# Patient Record
Sex: Male | Born: 1955
Health system: Southern US, Community
[De-identification: ages and names within clinical notes are randomized; demographics above are authoritative.]

## PROBLEM LIST (undated history)

## (undated) DIAGNOSIS — F419 Anxiety disorder, unspecified: Secondary | ICD-10-CM

## (undated) DIAGNOSIS — N200 Calculus of kidney: Secondary | ICD-10-CM

## (undated) DIAGNOSIS — N289 Disorder of kidney and ureter, unspecified: Secondary | ICD-10-CM

## (undated) DIAGNOSIS — E785 Hyperlipidemia, unspecified: Secondary | ICD-10-CM

## (undated) DIAGNOSIS — E7800 Pure hypercholesterolemia, unspecified: Secondary | ICD-10-CM

## (undated) DIAGNOSIS — G4700 Insomnia, unspecified: Secondary | ICD-10-CM

## (undated) HISTORY — DX: Insomnia, unspecified: G47.00

## (undated) HISTORY — DX: Anxiety disorder, unspecified: F41.9

## (undated) HISTORY — DX: Calculus of kidney: N20.0

## (undated) HISTORY — DX: Hyperlipidemia, unspecified: E78.5

## (undated) HISTORY — PX: HERNIA REPAIR: SHX51

## (undated) HISTORY — PX: VASECTOMY: SHX75

---

## 2000-12-21 ENCOUNTER — Ambulatory Visit: Admission: RE | Admit: 2000-12-21 | Discharge: 2000-12-21 | Payer: Self-pay | Admitting: *Deleted

## 2005-02-14 ENCOUNTER — Encounter: Admission: RE | Admit: 2005-02-14 | Discharge: 2005-02-14 | Payer: Self-pay | Admitting: General Surgery

## 2005-02-21 ENCOUNTER — Ambulatory Visit (HOSPITAL_COMMUNITY): Admission: RE | Admit: 2005-02-21 | Discharge: 2005-02-21 | Payer: Self-pay | Admitting: General Surgery

## 2005-02-21 ENCOUNTER — Ambulatory Visit (HOSPITAL_BASED_OUTPATIENT_CLINIC_OR_DEPARTMENT_OTHER): Admission: RE | Admit: 2005-02-21 | Discharge: 2005-02-21 | Payer: Self-pay | Admitting: General Surgery

## 2007-09-10 ENCOUNTER — Ambulatory Visit (HOSPITAL_COMMUNITY): Admission: RE | Admit: 2007-09-10 | Discharge: 2007-09-10 | Payer: Self-pay | Admitting: *Deleted

## 2010-11-15 NOTE — Op Note (Signed)
NAME:  Benjamin Caldwell, CALLENS NO.:  [id number]   MEDICAL RECORD NO.:  [id number]          PATIENT TYPE:  AMB   LOCATION:  ENDO                         FACILITY:  [facility]   PHYSICIAN:  Georgiana Spinner, M.D.    DATE OF BIRTH:  1956/05/05   DATE OF PROCEDURE:  09/10/2007  DATE OF DISCHARGE:                               OPERATIVE REPORT   PROCEDURE:  Colonoscopy.   INDICATIONS:  Rectal bleeding.   ANESTHESIA:  Demerol 70 mg, Versed 7 mg.   PROCEDURE:  With the patient mildly sedated in the left lateral  decubitus position, a rectal examination was performed which, to my  exam, was limited but normal.  Subsequently, the Pentax videoscopic  colonoscope was inserted to the rectum and passed under direct vision to  the cecum, identified by ileocecal valve and appendiceal orifice.  The  prep was slightly suboptimal, especially in the right colon where there  was yellowish material coating the mucosa which we could wash and  suction as best as possible, but some residual remained despite this.  From this point, the colonoscope was then slowly withdrawn taking  circumferential views of colonic mucosa after photographing the  ileocecal valve and appendiceal orifice, stopping next only in the  rectum which appeared normal on direct and showed hemorrhoids on  retroflexed view.  The endoscope was straightened and withdrawn.  The  patient's vital signs and pulse oximeter remained stable.  The patient  tolerated the procedure well without apparent complication.   FINDINGS:  Slightly suboptimal prep.  The patient indicates he did not  drink the whole prep, but left about over a glass in the container which  may have contributed to this poor prep, but otherwise an unremarkable  examination other than internal hemorrhoids.   PLAN:  Because of the prep, suggest repeat examination in five years or  as needed.           ______________________________  Georgiana Spinner, M.D.     GMO/MEDQ  D:  09/10/2007  T:  09/11/2007  Job:  [id number]

## 2010-11-18 NOTE — Op Note (Signed)
NAME:  Benjamin Caldwell, Benjamin Caldwell          ACCOUNT NO.:  [id number]   MEDICAL RECORD NO.:  [id number]          PATIENT TYPE:  AMB   LOCATION:  [facility]                         FACILITY:  [facility]   PHYSICIAN:  Adolph Pollack, M.D.DATE OF BIRTH:  Sep 17, 1955   DATE OF PROCEDURE:  02/21/2005  DATE OF DISCHARGE:                                 OPERATIVE REPORT   PREOPERATIVE DIAGNOSIS:  Recurrent left inguinal hernia.   POSTOPERATIVE DIAGNOSIS:  Recurrent left inguinal hernia.   PROCEDURE:  Laparoscopic repair of recurrent left inguinal hernia with mesh.   SURGEON:  Adolph Pollack, M.D.   ANESTHESIA:  General.   INDICATIONS:  This 55 year old male had a left inguinal hernia about 12  years ago. He noted some discomfort and swelling in the left groin and was  found to have a recurrent left inguinal hernia and now presents for repair.  We discussed the procedure and the risks preoperatively.   TECHNIQUE:  He is seen in the holding area and the left groin marked with my  initials. He is then brought to the operating room, placed supine on the  operating table and a general anesthetic was administered. Immediately  before coming to the operating room, he had voided. The hair on the  abdominal wall and groin area was clipped and the area was sterilely prepped  and draped. Dilute Marcaine solution was infiltrated in the subumbilical  region and a subumbilical incision was made in a transverse fashion through  the skin and subcutaneous tissue. Using blunt dissection, a left anterior  rectus sheath was identified and a small incision made in it. The underlying  left rectus muscle was retracted laterally and the posterior sheath  identified. A balloon dissection device was then placed into the  extraperitoneal space and balloon dissection was performed under  laparoscopic vision. This was mostly on the left side and mid portion of the  lower midline area. Following this, a trocar was placed into  the  extraperitoneal space and CO2 gas insufflated. The laparoscope was  introduced and under direct vision, two 5 mm trocars were placed in the  lower abdomen just to the right of the midline. I used blunt dissection to  identify the symphysis pubis and Cooper's ligament on the left. The direct  hernia could be identified. Using blunt dissection, I dissected fibrofatty  tissue away from the anterior and lateral abdominal wall up to the level of  the umbilicus. I subsequently used blunt dissection to isolate the spermatic  cord and dissect the peritoneum away from this. A small hole was made in the  peritoneum during the dissection, but I did not think it needed to be closed  and I was able to strip the peritoneum back to the level of the umbilicus.   I then reduced the contents of the recurrent direct hernia (extraperitoneal  fat). Once I had done this, I then brought a piece of 5 x 6 inch mesh with a  partial longitudinal slit cut into it and placed it into the left  extraperitoneal space and positioned it so as to cover the direct, indirect  and femoral  spaces. It was then anchored to Cooper's ligament, the anterior  and lateral abdominal walls with spiral tacks. The two tails of the mesh  were wrapped around the cord creating a new internal ring.   At this time, hemostasis was adequate. The inferior lateral aspect of the  mesh was held down. I then released the CO2 gas and watched the  extraperitoneal contents approximate the mesh. The remaining instruments and  trocars were removed and the remaining CO2 gas was evacuated as much as  possible.   The left anterior rectus sheath defect was then closed with interrupted zero  Vicryl sutures. Skin incisions were closed with 4-0 Monocryl subcuticular  stitches followed by Steri-Strips and sterile dressings. He tolerated the  procedure well without any apparent complications and was taken to the  recovery room in satisfactory  condition.      Adolph Pollack, M.D.  Electronically Signed     TJR/MEDQ  D:  02/21/2005  T:  02/22/2005  Job:  [id number]   cc:   Soyla Murphy. Renne Crigler, M.D.  [address]  [place]  [place] [address]  Fax: [phone]

## 2011-07-25 ENCOUNTER — Other Ambulatory Visit: Payer: Self-pay | Admitting: Internal Medicine

## 2011-07-25 DIAGNOSIS — I714 Abdominal aortic aneurysm, without rupture: Secondary | ICD-10-CM

## 2011-07-31 ENCOUNTER — Ambulatory Visit
Admission: RE | Admit: 2011-07-31 | Discharge: 2011-07-31 | Disposition: A | Discharge status: Home / Self Care | Payer: 59 | Source: Ambulatory Visit | Attending: Internal Medicine | Admitting: Internal Medicine

## 2011-07-31 DIAGNOSIS — I714 Abdominal aortic aneurysm, without rupture: Secondary | ICD-10-CM

## 2014-04-19 ENCOUNTER — Encounter (HOSPITAL_COMMUNITY): Payer: Self-pay | Admitting: Emergency Medicine

## 2014-04-19 ENCOUNTER — Emergency Department (HOSPITAL_COMMUNITY)
Admission: EM | Admit: 2014-04-19 | Discharge: 2014-04-19 | Disposition: A | Discharge status: Home / Self Care | Payer: 59 | Attending: Emergency Medicine | Admitting: Emergency Medicine

## 2014-04-19 ENCOUNTER — Emergency Department (HOSPITAL_COMMUNITY): Payer: 59

## 2014-04-19 DIAGNOSIS — R079 Chest pain, unspecified: Secondary | ICD-10-CM | POA: Diagnosis not present

## 2014-04-19 HISTORY — DX: Pure hypercholesterolemia, unspecified: E78.00

## 2014-04-19 LAB — CBC WITH DIFFERENTIAL/PLATELET
Basophils Absolute: 0 10*3/uL (ref 0.0–0.1)
Basophils Relative: 1 % (ref 0–1)
Eosinophils Absolute: 0.1 10*3/uL (ref 0.0–0.7)
Eosinophils Relative: 4 % (ref 0–5)
HCT: 44.4 % (ref 39.0–52.0)
Hemoglobin: 15.1 g/dL (ref 13.0–17.0)
Lymphocytes Relative: 39 % (ref 12–46)
Lymphs Abs: 1.4 10*3/uL (ref 0.7–4.0)
MCH: 31.6 pg (ref 26.0–34.0)
MCHC: 34 g/dL (ref 30.0–36.0)
MCV: 92.9 fL (ref 78.0–100.0)
Monocytes Absolute: 0.3 10*3/uL (ref 0.1–1.0)
Monocytes Relative: 9 % (ref 3–12)
Neutro Abs: 1.7 10*3/uL (ref 1.7–7.7)
Neutrophils Relative %: 47 % (ref 43–77)
Platelets: 212 10*3/uL (ref 150–400)
RBC: 4.78 MIL/uL (ref 4.22–5.81)
RDW: 13 % (ref 11.5–15.5)
WBC: 3.5 10*3/uL — ABNORMAL LOW (ref 4.0–10.5)

## 2014-04-19 LAB — TROPONIN I: Troponin I: <0.3 ng/mL (ref <0.30)

## 2014-04-19 LAB — BASIC METABOLIC PANEL
Anion gap: 11 (ref 5–15)
BUN: 17 mg/dL (ref 6–23)
CO2: 27 mEq/L (ref 19–32)
Calcium: 9.4 mg/dL (ref 8.4–10.5)
Chloride: 103 mEq/L (ref 96–112)
Creatinine, Ser: 0.93 mg/dL (ref 0.50–1.35)
GFR calc Af Amer: >90 mL/min (ref >90)
GFR calc non Af Amer: >90 mL/min (ref >90)
Glucose, Bld: 100 mg/dL — ABNORMAL HIGH (ref 70–99)
Potassium: 4.3 mEq/L (ref 3.7–5.3)
Sodium: 141 mEq/L (ref 137–147)

## 2014-04-19 NOTE — ED Notes (Signed)
Patient returned from X-ray 

## 2014-04-19 NOTE — ED Notes (Signed)
Patient transported to X-ray 

## 2014-04-19 NOTE — Discharge Instructions (Signed)

## 2014-04-19 NOTE — ED Notes (Signed)
Pt. Stated, when i GOT UP THIS MORNING TO GET READY FOR [facility] I STARTED TO HAVE SOME CHEST PAIN  With some nausea and some dizziness.  It came on real sudden and it was gone after 15 minutes.

## 2014-04-19 NOTE — ED Provider Notes (Signed)
CSN: [id number]     Arrival date & time 04/19/14  [id number] History   First MD Initiated Contact with Patient 04/19/14 1021     Chief Complaint  Patient presents with  . Chest Pain     (Consider location/radiation/quality/duration/timing/severity/associated sxs/prior Treatment) HPI  58yM with CP. Onset shortly before arrival while at home getting ready for church. Substernal pressure. Mild nausea. Pain did not radiate. Gradually subsided. Currently no complaints. No sob. No fever or chills. No unusual leg pain or swelling. Non smoker. No known CAD. Hx of HLD.   Past Medical History  Diagnosis Date  . Elevated cholesterol    History reviewed. No pertinent past surgical history. No family history on file. History  Substance Use Topics  . Smoking status: Never Smoker   . Smokeless tobacco: Not on file  . Alcohol Use: No    Review of Systems  All systems reviewed and negative, other than as noted in HPI.   Allergies  Review of patient's allergies indicates not on file.  Home Medications   Prior to Admission medications   Not on File   BP 144/71  Pulse 63  Temp(Src) 98.2 F (36.8 C) (Oral)  Resp 16  Ht 6' (1.829 m)  Wt 205 lb (92.987 kg)  BMI 27.80 kg/m2  SpO2 99% Physical Exam  Nursing note and vitals reviewed. Constitutional: He appears well-developed and well-nourished. No distress.  HENT:  Head: Normocephalic and atraumatic.  Eyes: Conjunctivae are normal. Right eye exhibits no discharge. Left eye exhibits no discharge.  Neck: Neck supple.  Cardiovascular: Normal rate, regular rhythm and normal heart sounds.  Exam reveals no gallop and no friction rub.   No murmur heard. Pulmonary/Chest: Effort normal and breath sounds normal. No respiratory distress. He exhibits no tenderness.  Abdominal: Soft. He exhibits no distension. There is no tenderness.  Musculoskeletal: He exhibits no edema and no tenderness.  Lower extremities symmetric as compared to each other. No  calf tenderness. Negative Homan's. No palpable cords.   Neurological: He is alert.  Skin: Skin is warm and dry.  Psychiatric: He has a normal mood and affect. His behavior is normal. Thought content normal.    ED Course  Procedures (including critical care time) Labs Review Labs Reviewed - No data to display  Imaging Review No results found.   EKG Interpretation None      EKG:  Rhythm: normal sinus Vent. rate 64 BPM PR interval 162 ms QRS duration 94 ms QT/QTc 408/420 ms ST segments: NSST changes. TWI in III. No old for comparison   MDM   Final diagnoses:  Chest pain, unspecified chest pain type   58yM with CP.  TWI in III which is nonspecific. Could be normal variant. I have no old EKG for comparison though. Rest of EKG pretty normal appearing.  Repeat EKG is unchanged. No further symptoms. ED w/u unremarkable otherwise.   Overall some symptoms somewhat concerning for  potential ACS, but some atypical features as well. Ultimately I feel pt needs stress testing. Discussed with pt/family concerning admission and rule out versus close outpt follow-up to obtain stress testing. Explained that might potentially get stress test if hospitalized, but it's not automatic. Pt seem to have good relationship with his PCP Dr Renne CriglerPharr. He would prefer to follow-up this week. I think this is reasonable. Discussed return precautions.     Raeford RazorStephen Liyla Radliff, MD 04/27/14 1400

## 2014-05-20 ENCOUNTER — Other Ambulatory Visit: Payer: Self-pay | Admitting: Internal Medicine

## 2014-05-20 DIAGNOSIS — IMO0001 Reserved for inherently not codable concepts without codable children: Secondary | ICD-10-CM

## 2014-06-01 ENCOUNTER — Ambulatory Visit
Admission: RE | Admit: 2014-06-01 | Discharge: 2014-06-01 | Disposition: A | Discharge status: Home / Self Care | Payer: 59 | Source: Ambulatory Visit | Attending: Internal Medicine | Admitting: Internal Medicine

## 2014-06-01 DIAGNOSIS — IMO0001 Reserved for inherently not codable concepts without codable children: Secondary | ICD-10-CM

## 2017-05-07 DIAGNOSIS — Z0000 Encounter for general adult medical examination without abnormal findings: Secondary | ICD-10-CM | POA: Diagnosis not present

## 2017-05-07 DIAGNOSIS — Z125 Encounter for screening for malignant neoplasm of prostate: Secondary | ICD-10-CM | POA: Diagnosis not present

## 2017-05-16 DIAGNOSIS — R7309 Other abnormal glucose: Secondary | ICD-10-CM | POA: Diagnosis not present

## 2017-05-16 DIAGNOSIS — Z0001 Encounter for general adult medical examination with abnormal findings: Secondary | ICD-10-CM | POA: Diagnosis not present

## 2017-05-16 DIAGNOSIS — Z23 Encounter for immunization: Secondary | ICD-10-CM | POA: Diagnosis not present

## 2017-05-16 DIAGNOSIS — E291 Testicular hypofunction: Secondary | ICD-10-CM | POA: Diagnosis not present

## 2017-05-31 DIAGNOSIS — E291 Testicular hypofunction: Secondary | ICD-10-CM | POA: Diagnosis not present

## 2017-06-06 DIAGNOSIS — E291 Testicular hypofunction: Secondary | ICD-10-CM | POA: Diagnosis not present

## 2018-02-05 ENCOUNTER — Other Ambulatory Visit: Payer: Self-pay

## 2018-02-05 ENCOUNTER — Emergency Department (HOSPITAL_COMMUNITY)
Admission: EM | Admit: 2018-02-05 | Discharge: 2018-02-05 | Disposition: A | Discharge status: Home / Self Care | Payer: 59 | Attending: Emergency Medicine | Admitting: Emergency Medicine

## 2018-02-05 ENCOUNTER — Encounter (HOSPITAL_COMMUNITY): Payer: Self-pay

## 2018-02-05 DIAGNOSIS — R399 Unspecified symptoms and signs involving the genitourinary system: Secondary | ICD-10-CM | POA: Diagnosis not present

## 2018-02-05 DIAGNOSIS — Z7982 Long term (current) use of aspirin: Secondary | ICD-10-CM | POA: Diagnosis not present

## 2018-02-05 DIAGNOSIS — N10 Acute tubulo-interstitial nephritis: Secondary | ICD-10-CM | POA: Diagnosis not present

## 2018-02-05 DIAGNOSIS — Z79899 Other long term (current) drug therapy: Secondary | ICD-10-CM | POA: Insufficient documentation

## 2018-02-05 DIAGNOSIS — R509 Fever, unspecified: Secondary | ICD-10-CM | POA: Diagnosis present

## 2018-02-05 DIAGNOSIS — N390 Urinary tract infection, site not specified: Secondary | ICD-10-CM | POA: Diagnosis not present

## 2018-02-05 DIAGNOSIS — R300 Dysuria: Secondary | ICD-10-CM | POA: Diagnosis not present

## 2018-02-05 HISTORY — DX: Disorder of kidney and ureter, unspecified: N28.9

## 2018-02-05 LAB — URINALYSIS, ROUTINE W REFLEX MICROSCOPIC
BILIRUBIN URINE: NEGATIVE
Bacteria, UA: NONE SEEN
GLUCOSE, UA: NEGATIVE mg/dL
HGB URINE DIPSTICK: NEGATIVE
KETONES UR: NEGATIVE mg/dL
Nitrite: POSITIVE — AB
PH: 6 (ref 5.0–8.0)
Protein, ur: NEGATIVE mg/dL
SPECIFIC GRAVITY, URINE: 1.004 — AB (ref 1.005–1.030)

## 2018-02-05 MED ORDER — LIDOCAINE HCL (PF) 1 % IJ SOLN: 1.5000 mL | Freq: Once | INTRAMUSCULAR | Status: DC

## 2018-02-05 MED ORDER — CEFTRIAXONE SODIUM 1 G IJ SOLR
1.0000 g | Freq: Once | INTRAMUSCULAR | Status: AC
  Administered 2018-02-05: 1 g via INTRAMUSCULAR
  Filled 2018-02-05: qty 10

## 2018-02-05 MED ORDER — ONDANSETRON 8 MG PO TBDP: 8.0000 mg | ORAL_TABLET | Freq: Three times a day (TID) | ORAL | 0 refills | Status: DC | PRN

## 2018-02-05 MED ORDER — LIDOCAINE HCL 1 % IJ SOLN
INTRAMUSCULAR | Status: AC
  Administered 2018-02-05: 1.5 mL
  Filled 2018-02-05: qty 20

## 2018-02-05 NOTE — ED Provider Notes (Signed)
[facility]-EMERGENCY DEPT Provider Note   CSN: [id number] Arrival date & time: 02/05/18  1754     History   Chief Complaint Chief Complaint  Patient presents with  . abnormal labs  . Dysuria  . Fever    HPI Benjamin Caldwell is a 62 y.o. male.  HPI  62 year old male comes in with chief complaint of fevers and chills.  Patient reports that he started having pain with urination and urinary frequency yesterday.  Patient went to see his PCP and was started on Bactrim.  This evening after his first dose of antibiotics he started feeling sicker.  Patient developed a temp of 102.4, he called his PCP who advised that he come to the ER.  Patient denies any nausea, vomiting.  He denies any flank pain, lower abdominal pain, confusion, shaking chills or sweats.  Patient is immunocompetent and not immunosuppressed.  There is no history of recent instrumentation or the GU system and patient denies any history of UTI.  Additionally, patient has enlarged prostate, however denies any rectal pain or history of prostatitis.  Past Medical History:  Diagnosis Date  . Elevated cholesterol   . Renal disorder     There are no active problems to display for this patient.   Past Surgical History:  Procedure Laterality Date  . HERNIA REPAIR    . VASECTOMY          Home Medications    Prior to Admission medications   Medication Sig Start Date End Date Taking? Authorizing Provider  aspirin EC 81 MG tablet Take 81 mg by mouth every evening.    Yes [provider]  atorvastatin (LIPITOR) 40 MG tablet Take 40 mg by mouth every evening.  11/29/17  Yes [provider]  Cranberry-Vitamin C-Probiotic (AZO CRANBERRY) 250-30 MG TABS Take 1 tablet by mouth daily.   Yes [provider]  fluticasone (FLONASE) 50 MCG/ACT nasal spray Place 1 spray into both nostrils every morning. 01/23/14  Yes [provider]  metoprolol tartrate (LOPRESSOR) 25  MG tablet Take 25 mg by mouth daily.   Yes [provider]  mirtazapine (REMERON) 30 MG tablet Take 15 mg by mouth at bedtime.   Yes [provider]  sulfamethoxazole-trimethoprim (BACTRIM DS,SEPTRA DS) 800-160 MG tablet Take 1 tablet by mouth 2 (two) times daily.  02/05/18  Yes [provider]  zolpidem (AMBIEN) 10 MG tablet Take 3.3 mg by mouth at bedtime as needed for sleep.  04/13/14  Yes [provider]  ondansetron (ZOFRAN ODT) 8 MG disintegrating tablet Take 1 tablet (8 mg total) by mouth every 8 (eight) hours as needed for nausea. 02/05/18   Derwood Kaplan, MD    Family History Family History  Problem Relation Age of Onset  . Heart failure Mother   . AAA (abdominal aortic aneurysm) Father   . Kidney failure Father     Social History Social History   Tobacco Use  . Smoking status: Never Smoker  . Smokeless tobacco: Never Used  Substance Use Topics  . Alcohol use: No  . Drug use: No     Allergies   Patient has no known allergies.   Review of Systems Review of Systems  Constitutional: Positive for activity change, fatigue and fever. Negative for diaphoresis.  Gastrointestinal: Negative for nausea and vomiting.  Genitourinary: Positive for dysuria and frequency. Negative for flank pain and testicular pain.  Allergic/Immunologic: Negative for immunocompromised state.  Neurological: Positive for weakness.  Physical Exam Updated Vital Signs BP 119/64 (BP Location: Left Arm)   Pulse 79   Temp 98.5 F (36.9 C) (Oral)   Resp 16   Ht 6' (1.829 m)   Wt 93.4 kg (206 lb)   SpO2 96%   BMI 27.94 kg/m   Physical Exam  Constitutional: He is oriented to person, place, and time. He appears well-developed.  HENT:  Head: Atraumatic.  Neck: Neck supple.  Cardiovascular: Normal rate.  Pulmonary/Chest: Effort normal.  Neurological: He is alert and oriented to person, place, and time.  Skin: Skin is warm.  Nursing note and vitals  reviewed.    ED Treatments / Results  Labs (all labs ordered are listed, but only abnormal results are displayed) Labs Reviewed  URINE CULTURE  URINALYSIS, ROUTINE W REFLEX MICROSCOPIC    EKG None  Radiology No results found.  Procedures Procedures (including critical care time)  Medications Ordered in ED Medications  cefTRIAXone (ROCEPHIN) injection 1 g (has no administration in time range)     Initial Impression / Assessment and Plan / ED Course  I have reviewed the triage vital signs and the nursing notes.  Pertinent labs & imaging results that were available during my care of the patient were reviewed by me and considered in my medical decision making (see chart for details).     62 year old male comes in with chief complaint of burning with urination and fevers.  Clinically patient has a UTI.  He was seen by his PCP earlier today and started on Bactrim for 14 days.  Given the new systemic symptoms, there could be an element of pyelonephritis.  We will give him IM ceftriaxone, and he will continue to take his Bactrim.  I verified the Bactrim sensitivities with the pharmacist and the duration of antibiotics for pyonephritis -patient will take his medications for 14 days.  Additionally, will send urine cultures.  Final Clinical Impressions(s) / ED Diagnoses   Final diagnoses:  Acute pyelonephritis    ED Discharge Orders        Ordered    ondansetron (ZOFRAN ODT) 8 MG disintegrating tablet  Every 8 hours PRN     02/05/18 1918       Derwood KaplanNanavati, Nikira Kushnir, MD 02/05/18 1925

## 2018-02-05 NOTE — Discharge Instructions (Signed)
We saw you in the ER for fevers. We suspect that you might be having pyelonephritis. We do not think that you are septic at this time.  We have given you 1 dose of intramuscular antibiotic, which should start slowing down the growth of bacteria. You need to continue taking Bactrim for the next 14 days. He should start feeling better over the next 2 or 3 days.  Return to the ER immediately if you start having worsening symptoms, inability to keep any medications down, confusion, dizziness, sweating.

## 2018-02-05 NOTE — ED Triage Notes (Signed)
Patient was called by his PCP and was told to come to the ED for abnormal labs microscopic urine WBC 20-3-, and 4+ bacteria, + nitrates. Patient reports that he has been having burning with urination and voiding small frequent amounts. Patient states he had a temp 102.4-patient took Advil at 1430 today.

## 2018-02-07 LAB — URINE CULTURE: Culture: NO GROWTH

## 2018-05-22 DIAGNOSIS — Z1159 Encounter for screening for other viral diseases: Secondary | ICD-10-CM | POA: Diagnosis not present

## 2018-05-22 DIAGNOSIS — Z0000 Encounter for general adult medical examination without abnormal findings: Secondary | ICD-10-CM | POA: Diagnosis not present

## 2018-05-22 DIAGNOSIS — Z125 Encounter for screening for malignant neoplasm of prostate: Secondary | ICD-10-CM | POA: Diagnosis not present

## 2018-05-28 DIAGNOSIS — Z0000 Encounter for general adult medical examination without abnormal findings: Secondary | ICD-10-CM | POA: Diagnosis not present

## 2018-05-28 DIAGNOSIS — Z1212 Encounter for screening for malignant neoplasm of rectum: Secondary | ICD-10-CM | POA: Diagnosis not present

## 2018-05-28 DIAGNOSIS — E291 Testicular hypofunction: Secondary | ICD-10-CM | POA: Diagnosis not present

## 2018-05-28 DIAGNOSIS — E7800 Pure hypercholesterolemia, unspecified: Secondary | ICD-10-CM | POA: Diagnosis not present

## 2018-06-04 DIAGNOSIS — Z23 Encounter for immunization: Secondary | ICD-10-CM | POA: Diagnosis not present

## 2018-09-04 DIAGNOSIS — Z23 Encounter for immunization: Secondary | ICD-10-CM | POA: Diagnosis not present

## 2019-08-13 ENCOUNTER — Ambulatory Visit: Payer: Self-pay | Admitting: Cardiology

## 2019-08-14 ENCOUNTER — Other Ambulatory Visit: Payer: Self-pay

## 2019-08-14 ENCOUNTER — Encounter: Payer: Self-pay | Admitting: Cardiology

## 2019-08-14 ENCOUNTER — Ambulatory Visit: Payer: Self-pay | Admitting: Cardiology

## 2019-08-14 ENCOUNTER — Ambulatory Visit: Payer: 59 | Admitting: Cardiology

## 2019-08-14 VITALS — BP 140/70 | HR 67 | Temp 98.6°F | Resp 16 | Ht 72.0 in | Wt 206.0 lb

## 2019-08-14 DIAGNOSIS — E782 Mixed hyperlipidemia: Secondary | ICD-10-CM

## 2019-08-14 DIAGNOSIS — R002 Palpitations: Secondary | ICD-10-CM

## 2019-08-14 DIAGNOSIS — I10 Essential (primary) hypertension: Secondary | ICD-10-CM

## 2019-08-14 NOTE — Progress Notes (Signed)
REASON FOR CONSULT: Palpitations  Chief Complaint  Patient presents with  . Palpitations  . New Patient (Initial Visit)    REQUESTING PHYSICIAN:  Deland Pretty, MD [address] [place],  [place] [address]  HPI  Benjamin Caldwell is a 64 y.o. male who presents to the office with a chief complaint of " palpitations."  Patient's past medical history and cardiac risk factors include: Hyperlipidemia.   Patient is accompanied by his wife at today's office visit.    Patient states that he has been having palpitations for the last 2 weeks.  The palpitations started suddenly and were intermittent for the following 4 to 5 days.  Patient states that the palpitations were more noticeable while he is at rest and goes away with activity.  Patient states that he did not have any associated symptoms of chest pain or anginal equivalent or heart failure symptoms.  No improving or worsening factors.  No change in physical activity.  Patient states that he works for [employer] and his work is labor-intensive and still is able to continue to do so.  Because his symptoms were present for 4 to 5 days he decided to go see his primary care.  At the primary care [name] care's office he was noted to have rare PVCs with underlying normal sinus rhythm.  We discussed lifestyle changes such as decreasing caffeine intake.  Patient was prior consuming 3 cups of coffee, and Coke 0 on a daily basis, chocolate, etc.  Patient states that after his primary care visit he cut caffeine intake completely and his symptoms have resolved.  Review of systems positive for: palpitations (last episode 1 week ago). Currently patient denies chest pain, shortness of breath at rest or effort related symptoms, lightheadedness, dizziness, orthopnea, paroxysmal nocturnal dyspnea, lower extremity swelling, near syncope, syncopal events, hematochezia, hemoptysis, hematemesis, melanotic stools, no symptoms of amaurosis fugax,  motor or sensory symptoms or dysphasia in the last 6 months.   Denies prior history of coronary artery disease, myocardial infarction, congestive heart failure, deep venous thrombosis, pulmonary embolism, stroke, transient ischemic attack.  FUNCTIONAL STATUS: He works for [employer] and is active due to work (walking and steps).   ALLERGIES: No Known Allergies   MEDICATION LIST PRIOR TO VISIT: Current Outpatient Medications on File Prior to Visit  Medication Sig Dispense Refill  . aspirin EC 81 MG tablet Take 81 mg by mouth every evening.     Marland Kitchen atorvastatin (LIPITOR) 40 MG tablet Take 40 mg by mouth every evening.   4  . Cranberry-Vitamin C-Probiotic (AZO CRANBERRY) 250-30 MG TABS Take 1 tablet by mouth daily.    . fluticasone (FLONASE) 50 MCG/ACT nasal spray Place 1 spray into both nostrils every morning.    . metoprolol succinate (TOPROL-XL) 25 MG 24 hr tablet Take 25 mg by mouth daily.    . mirtazapine (REMERON) 30 MG tablet Take 15 mg by mouth at bedtime.    . traZODone (DESYREL) 50 MG tablet Take 1-2 tablets by mouth at bedtime.     No current facility-administered medications on file prior to visit.    PAST MEDICAL HISTORY: Past Medical History:  Diagnosis Date  . Anxiety   . HLD (hyperlipidemia)   . Insomnia   . Kidney stone     PAST SURGICAL HISTORY: The patient  Past Surgical History:  Procedure Laterality Date  . HERNIA REPAIR    . VASECTOMY      FAMILY HISTORY: The patient family history  includes AAA (abdominal aortic aneurysm) in his father; Heart failure in his mother; Kidney failure in his father.   SOCIAL HISTORY:  The patient  reports that he has never smoked. He has never used smokeless tobacco. He reports that he does not drink alcohol or use drugs.  14 ORGAN REVIEW OF SYSTEMS: CONSTITUTIONAL: No fever or significant weight loss EYES: No recent significant visual change EARS, NOSE, MOUTH, THROAT: No recent significant change in  hearing CARDIOVASCULAR: See discussion in subjective/HPI RESPIRATORY: See discussion in subjective/HPI GASTROINTESTINAL: No recent complaints of abdominal pain GENITOURINARY: No recent significant change in genitourinary status MUSCULOSKELETAL: No recent significant change in musculoskeletal status INTEGUMENTARY: No recent rash NEUROLOGIC: No recent significant change in motor function PSYCHIATRIC: No recent significant change in mood ENDOCRINOLOGIC: No recent significant change in endocrine status HEMATOLOGIC/LYMPHATIC: No recent significant unexpected bruising ALLERGIC/IMMUNOLOGIC: No recent unexplained allergic reaction  PHYSICAL EXAM: Blood pressure 140/70, pulse 67, temperature 98.6 F (37 C), temperature source Temporal, resp. rate 16, height 6' (1.829 m), weight 206 lb (93.4 kg), SpO2 97 %. Body mass index is 27.94 kg/m. CONSTITUTIONAL: Well-developed and well-nourished. No acute distress.  SKIN: Skin is warm and dry. No rash noted. No cyanosis. No pallor. No jaundice HEAD: Normocephalic and atraumatic.  EYES: No scleral icterus MOUTH/THROAT: Moist oral membranes.  NECK: No JVD present. No thyromegaly noted. No carotid bruits  LYMPHATIC: No visible cervical adenopathy.  CHEST Normal respiratory effort. No intercostal retractions  LUNGS: Clear to auscultation bilaterally.  No stridor. No wheezes. No rales.  CARDIOVASCULAR: Regular rate and rhythm, positive S1-S2, no murmurs rubs or gallops appreciated. ABDOMINAL: No apparent ascites.  EXTREMITIES: No peripheral edema  HEMATOLOGIC: No significant bruising NEUROLOGIC: Oriented to person, place, and time. Nonfocal. Normal muscle tone.  PSYCHIATRIC: Normal mood and affect. Normal behavior. Cooperative  CARDIAC DATABASE: EKG: 08/14/2019: Normal sinus rhythm with a ventricular rate of 67 bpm, normal axis deviation, poor R wave progression, no underlying ischemia or injury pattern.  Echocardiogram: 05/21/2014: LVEF 65%, grade 1  diastolic dysfunction, left atrium mildly dilated, right atrium mildly dilated, mild aortic regurgitation, mild mitral regurgitation, mild TR, no evidence of pulmonary pretension.  Stress Testing:  Exercise sestamibi stress test May 08, 2014: Myocardial perfusion imaging is normal.  Left ventricular systolic function is normal.  No high-grade regional wall motion abnormalities.  LVEF by gated SPECT 68%.  The study was reported to be low risk study.  Heart Catheterization: None  LABORATORY DATA: CBC    Component Value Date/Time   WBC 3.5 (L) 04/19/2014 1040   RBC 4.78 04/19/2014 1040   HGB 15.1 04/19/2014 1040   HCT 44.4 04/19/2014 1040   PLT 212 04/19/2014 1040   MCV 92.9 04/19/2014 1040   MCH 31.6 04/19/2014 1040   MCHC 34.0 04/19/2014 1040   RDW 13.0 04/19/2014 1040   LYMPHSABS 1.4 04/19/2014 1040   MONOABS 0.3 04/19/2014 1040   EOSABS 0.1 04/19/2014 1040   BASOSABS 0.0 04/19/2014 1040    CMP     Component Value Date/Time   NA 141 04/19/2014 1040   K 4.3 04/19/2014 1040   CL 103 04/19/2014 1040   CO2 27 04/19/2014 1040   GLUCOSE 100 (H) 04/19/2014 1040   BUN 17 04/19/2014 1040   CREATININE 0.93 04/19/2014 1040   CALCIUM 9.4 04/19/2014 1040   GFRNONAA >90 04/19/2014 1040   GFRAA >90 04/19/2014 1040   EXTERNAL LABS: June 19, 2019: BUN 11, creatinine 0.99, EGFR 84, potassium 4.3, Total cholesterol 130, triglycerides 62,  HDL 60, LDL 57  FINAL MEDICATION LIST END OF ENCOUNTER: No orders of the defined types were placed in this encounter.   Medications Discontinued During This Encounter  Medication Reason  . ondansetron (ZOFRAN ODT) 8 MG disintegrating tablet Error  . sulfamethoxazole-trimethoprim (BACTRIM DS,SEPTRA DS) 800-160 MG tablet Error  . traZODone (DESYREL) 50 MG tablet Error  . zolpidem (AMBIEN) 10 MG tablet Error  . metoprolol tartrate (LOPRESSOR) 25 MG tablet Error     IMPRESSION:    ICD-10-CM   1. Palpitations  R00.2 EKG 12-Lead    TSH     PCV ECHOCARDIOGRAM COMPLETE  2. Essential hypertension  I10   3. Mixed hyperlipidemia  E78.2      RECOMMENDATIONS: Palpitations: Symptoms have currently resolved since the cessation of caffeine intake. Check EKG, results noted above. Patient is currently on Toprol-XL. Check echocardiogram to evaluate for structural heart disease and valvular abnormalities. Check TSH to rule out underlying thyroid disease. Patient is asked to come to the office sooner if his symptoms of palpitation resume upon reintroducing caffeine in the diet.  If the patient has worsening palpitations the plan is to do a monitor to evaluate for arrhythmia and will also consider increasing AV nodal blocking agents.  Benign essential hypertension: Patient is asked to keep a log of his blood pressure and to bring it in at the next office visit. Patient blood pressure is currently elevated but states that at home it usually around 022 mmHg systolic. Low-salt diet recommended.  Mixed hyperlipidemia: Currently on Lipitor 40 mg p.o. nightly. Most recent lipid profile reviewed. LDL currently at goal.  Orders Placed This Encounter  Procedures  . TSH  . EKG 12-Lead  . PCV ECHOCARDIOGRAM COMPLETE   --Continue cardiac medications as reconciled. --Return in about 4 weeks (around 09/11/2019) for Discussion of test results and symptoms. or sooner if needed. --Continue follow-up with your primary care physician regarding the management of your other chronic comorbid conditions.  Patient's questions and concerns were addressed to his and his wife's satisfaction. He voices understanding of the instructions provided during this encounter.   This note was created using a voice recognition software as a result there may be grammatical errors inadvertently enclosed that do not reflect the nature of this encounter. Every attempt is made to correct such errors.  Rex Kras, DO, [facility]. [place] Office:  [phone]

## 2019-08-14 NOTE — Patient Instructions (Signed)
Please remember to bring in your medication bottles in at the next visit.   New Medications that were added at today's visit:  None  Medications that were discontinued at today's visit: None  Office will call you to have the following tests scheduled:  Echo  Please get labs done in about 1 week at the nearest [facility].  Recommend follow up with your PCP as scheduled.

## 2019-08-15 ENCOUNTER — Telehealth: Payer: Self-pay

## 2019-08-15 LAB — TSH: TSH: 1.66 u[IU]/mL (ref 0.450–4.500)

## 2019-08-15 NOTE — Telephone Encounter (Signed)
LMOM for pt to call with his f/u appt information given to him at checkout on 08/14/2019. Dr. Emelda Brothers schedule was not open yet for me to enter appt.Marland KitchenMarland KitchenPlease correct appt if entered in wrong. Thanks

## 2019-08-15 NOTE — Telephone Encounter (Signed)
Pt called back w/appt info and it has be updated in chart

## 2019-08-21 ENCOUNTER — Other Ambulatory Visit: Payer: 59

## 2019-08-25 ENCOUNTER — Other Ambulatory Visit: Payer: 59

## 2019-08-29 ENCOUNTER — Other Ambulatory Visit: Payer: Self-pay

## 2019-08-29 ENCOUNTER — Ambulatory Visit: Payer: 59

## 2019-08-29 DIAGNOSIS — I340 Nonrheumatic mitral (valve) insufficiency: Secondary | ICD-10-CM

## 2019-08-29 DIAGNOSIS — R002 Palpitations: Secondary | ICD-10-CM

## 2019-09-11 ENCOUNTER — Ambulatory Visit: Payer: 59 | Admitting: Cardiology

## 2019-09-11 ENCOUNTER — Other Ambulatory Visit: Payer: Self-pay

## 2019-09-11 ENCOUNTER — Encounter: Payer: Self-pay | Admitting: Cardiology

## 2019-09-11 VITALS — BP 125/64 | HR 65 | Temp 97.7°F | Resp 16 | Ht 72.0 in | Wt 200.0 lb

## 2019-09-11 DIAGNOSIS — I10 Essential (primary) hypertension: Secondary | ICD-10-CM

## 2019-09-11 DIAGNOSIS — R002 Palpitations: Secondary | ICD-10-CM

## 2019-09-11 DIAGNOSIS — E782 Mixed hyperlipidemia: Secondary | ICD-10-CM

## 2019-09-11 NOTE — Progress Notes (Signed)
Chief Complaint  Patient presents with  . Palpitations    Follow up   HPI  Benjamin Caldwell is a 64 y.o. male who presents to the office with a chief complaint of " palpitations and follow-up."   Patient is accompanied by his wife at today's office visit.    Since last office visit patient states that his symptoms of palpitations have resolved.  He has not had any additional episodes of palpitations.  And he attributes the improvement of his symptoms to changing to decaffeinated coffee.  Patient had an echocardiogram since last office visit which noted a preserved left ventricular systolic function, grade 2 diastolic impairment with mild to moderate valvular heart disease.  The findings were discussed in detail with both the patient and his wife at today's office visit.  Patient also brought in his list of blood pressures which are currently at goal.   Currently patient denies chest pain, shortness of breath at rest or effort related symptoms, palpitations, lightheadedness, dizziness, orthopnea, paroxysmal nocturnal dyspnea, lower extremity swelling, near syncope, syncopal events, hematochezia, hemoptysis, hematemesis, melanotic stools, no symptoms of amaurosis fugax, motor or sensory symptoms or dysphasia in the last 6 months.   Denies prior history of coronary artery disease, myocardial infarction, congestive heart failure, deep venous thrombosis, pulmonary embolism, stroke, transient ischemic attack.  FUNCTIONAL STATUS: He works for [employer] and is active due to work (walking and steps).   ALLERGIES: No Known Allergies   MEDICATION LIST PRIOR TO VISIT: Current Outpatient Medications on File Prior to Visit  Medication Sig Dispense Refill  . aspirin EC 81 MG tablet Take 81 mg by mouth every evening.     Marland Kitchen atorvastatin (LIPITOR) 40 MG tablet Take 40 mg by mouth every evening.   4  . Cranberry-Vitamin C-Probiotic (AZO CRANBERRY) 250-30 MG TABS Take 1 tablet by mouth daily.    .  fluticasone (FLONASE) 50 MCG/ACT nasal spray Place 1 spray into both nostrils every morning.    . metoprolol succinate (TOPROL-XL) 25 MG 24 hr tablet Take 25 mg by mouth daily.    . mirtazapine (REMERON) 30 MG tablet Take 15 mg by mouth at bedtime.    . traZODone (DESYREL) 50 MG tablet Take 1-2 tablets by mouth at bedtime.     No current facility-administered medications on file prior to visit.    PAST MEDICAL HISTORY: Past Medical History:  Diagnosis Date  . Anxiety   . HLD (hyperlipidemia)   . Insomnia   . Kidney stone     PAST SURGICAL HISTORY: The patient  Past Surgical History:  Procedure Laterality Date  . HERNIA REPAIR    . VASECTOMY      FAMILY HISTORY: The patient family history includes AAA (abdominal aortic aneurysm) in his father; Heart failure in his mother; Kidney failure in his father.   SOCIAL HISTORY:  The patient  reports that he has never smoked. He has never used smokeless tobacco. He reports that he does not drink alcohol or use drugs.  14 ORGAN REVIEW OF SYSTEMS: CONSTITUTIONAL: No fever or significant weight loss EYES: No recent significant visual change EARS, NOSE, MOUTH, THROAT: No recent significant change in hearing CARDIOVASCULAR: See discussion in subjective/HPI RESPIRATORY: See discussion in subjective/HPI GASTROINTESTINAL: No recent complaints of abdominal pain GENITOURINARY: No recent significant change in genitourinary status MUSCULOSKELETAL: No recent significant change in musculoskeletal status INTEGUMENTARY: No recent rash NEUROLOGIC: No recent significant change in motor function PSYCHIATRIC: No recent significant change in mood ENDOCRINOLOGIC: No  recent significant change in endocrine status HEMATOLOGIC/LYMPHATIC: No recent significant unexpected bruising ALLERGIC/IMMUNOLOGIC: No recent unexplained allergic reaction  PHYSICAL EXAM: Blood pressure 125/64, pulse 65, temperature 97.7 F (36.5 C), temperature source Temporal, resp.  rate 16, height 6' (1.829 m), weight 200 lb (90.7 kg), SpO2 98 %. Body mass index is 27.12 kg/m. CONSTITUTIONAL: Well-developed and well-nourished. No acute distress.  SKIN: Skin is warm and dry. No rash noted. No cyanosis. No pallor. No jaundice HEAD: Normocephalic and atraumatic.  EYES: No scleral icterus MOUTH/THROAT: Moist oral membranes.  NECK: No JVD present. No thyromegaly noted. No carotid bruits  LYMPHATIC: No visible cervical adenopathy.  CHEST Normal respiratory effort. No intercostal retractions  LUNGS: Clear to auscultation bilaterally.  No stridor. No wheezes. No rales.  CARDIOVASCULAR: Regular rate and rhythm, positive S1-S2, no murmurs rubs or gallops appreciated. ABDOMINAL: No apparent ascites.  EXTREMITIES: No peripheral edema  HEMATOLOGIC: No significant bruising NEUROLOGIC: Oriented to person, place, and time. Nonfocal. Normal muscle tone.  PSYCHIATRIC: Normal mood and affect. Normal behavior. Cooperative  CARDIAC DATABASE: EKG: 08/14/2019: Normal sinus rhythm with a ventricular rate of 67 bpm, normal axis deviation, poor R wave progression, no underlying ischemia or injury pattern.  Echocardiogram: 05/21/2014: LVEF 32%, grade 1 diastolic dysfunction, left atrium mildly dilated, right atrium mildly dilated, mild aortic regurgitation, mild mitral regurgitation, mild TR, no evidence of pulmonary pretension.  08/29/2019: LVEF 95%, grade 2 diastolic impairment, moderately dilated left atrium, mild AR, mild to moderate MR, mild TR, RVSP 38 mmHg.  Stress Testing:  Exercise sestamibi stress test May 08, 2014: Myocardial perfusion imaging is normal.  Left ventricular systolic function is normal.  No high-grade regional wall motion abnormalities.  LVEF by gated SPECT 68%.  The study was reported to be low risk study.  Heart Catheterization: None  LABORATORY DATA: CBC    Component Value Date/Time   WBC 3.5 (L) 04/19/2014 1040   RBC 4.78 04/19/2014 1040   HGB 15.1  04/19/2014 1040   HCT 44.4 04/19/2014 1040   PLT 212 04/19/2014 1040   MCV 92.9 04/19/2014 1040   MCH 31.6 04/19/2014 1040   MCHC 34.0 04/19/2014 1040   RDW 13.0 04/19/2014 1040   LYMPHSABS 1.4 04/19/2014 1040   MONOABS 0.3 04/19/2014 1040   EOSABS 0.1 04/19/2014 1040   BASOSABS 0.0 04/19/2014 1040    CMP     Component Value Date/Time   NA 141 04/19/2014 1040   K 4.3 04/19/2014 1040   CL 103 04/19/2014 1040   CO2 27 04/19/2014 1040   GLUCOSE 100 (H) 04/19/2014 1040   BUN 17 04/19/2014 1040   CREATININE 0.93 04/19/2014 1040   CALCIUM 9.4 04/19/2014 1040   GFRNONAA >90 04/19/2014 1040   GFRAA >90 04/19/2014 1040   EXTERNAL LABS: June 19, 2019: BUN 11, creatinine 0.99, EGFR 84, potassium 4.3, Total cholesterol 130, triglycerides 62, HDL 60, LDL 57  FINAL MEDICATION LIST END OF ENCOUNTER: No orders of the defined types were placed in this encounter.   There are no discontinued medications.   IMPRESSION:    ICD-10-CM   1. Palpitations  R00.2 ECHOCARDIOGRAM STRESS TEST  2. Mixed hyperlipidemia  E78.2 ECHOCARDIOGRAM STRESS TEST  3. Essential hypertension  I10      RECOMMENDATIONS: Palpitations: Resolved.  Since last office visit patient has been asymptomatic.    TSH within normal limits.    Echocardiogram results reviewed with the patient and his wife at today's office visit.    Continue metoprolol.    No  additional work-up needed at this time.    Benign essential hypertension: Currently at goal. . Office blood pressure is at goal.  . Medication reconciled.  . Patient is asked to keep a log of both blood pressure and pulse so that medications can be titrated based on a blood pressure trend as opposed to isolated blood pressure readings in the office. . If the blood pressure is consistently greater than 126mHg patient is asked to call the office to for medication titration sooner than the next office visit.  . Low salt diet recommended. A diet that is rich  in fruits, vegetables, legumes, and low-fat dairy products and low in snacks, sweets, and meats (such as the Dietary Approaches to Stop Hypertension [DASH] diet).   Mixed hyperlipidemia:  Currently on Lipitor 40 mg p.o. nightly.  Most recent lipid profile reviewed.  LDL currently at goal.  Given the patient's cardiovascular risk factors recommended stress echocardiogram to reevaluate exercise-induced ischemia.  To be performed prior to next office visit.  Orders Placed This Encounter  Procedures  . ECHOCARDIOGRAM STRESS TEST   --Continue cardiac medications as reconciled. --Return in about 6 months (around 03/13/2020). or sooner if needed. --Continue follow-up with your primary care physician regarding the management of your other chronic comorbid conditions.  Patient's questions and concerns were addressed to his and his wife's satisfaction. He voices understanding of the instructions provided during this encounter.   This note was created using a voice recognition software as a result there may be grammatical errors inadvertently enclosed that do not reflect the nature of this encounter. Every attempt is made to correct such errors.  SRex Kras DO, [facility]. PAniwaOffice: [phone]

## 2019-09-11 NOTE — Patient Instructions (Signed)
Please remember to bring in your medication bottles in at the next visit.   New Medications that were added at today's visit:  None  Medications that were discontinued at today's visit: None  Office will call you to have the following tests scheduled:  Stress Echo  Recommend follow up with your PCP as scheduled.

## 2021-06-20 DIAGNOSIS — Z125 Encounter for screening for malignant neoplasm of prostate: Secondary | ICD-10-CM | POA: Diagnosis not present

## 2021-06-20 DIAGNOSIS — E7801 Familial hypercholesterolemia: Secondary | ICD-10-CM | POA: Diagnosis not present

## 2021-06-20 DIAGNOSIS — I10 Essential (primary) hypertension: Secondary | ICD-10-CM | POA: Diagnosis not present

## 2021-06-20 DIAGNOSIS — R7309 Other abnormal glucose: Secondary | ICD-10-CM | POA: Diagnosis not present

## 2021-06-23 DIAGNOSIS — Z0000 Encounter for general adult medical examination without abnormal findings: Secondary | ICD-10-CM | POA: Diagnosis not present

## 2021-06-23 DIAGNOSIS — N400 Enlarged prostate without lower urinary tract symptoms: Secondary | ICD-10-CM | POA: Diagnosis not present

## 2021-06-23 DIAGNOSIS — R7309 Other abnormal glucose: Secondary | ICD-10-CM | POA: Diagnosis not present

## 2021-06-23 DIAGNOSIS — Z23 Encounter for immunization: Secondary | ICD-10-CM | POA: Diagnosis not present

## 2021-06-23 DIAGNOSIS — F419 Anxiety disorder, unspecified: Secondary | ICD-10-CM | POA: Diagnosis not present

## 2021-06-23 DIAGNOSIS — I700 Atherosclerosis of aorta: Secondary | ICD-10-CM | POA: Diagnosis not present

## 2021-06-28 ENCOUNTER — Other Ambulatory Visit: Payer: Self-pay | Admitting: Internal Medicine

## 2021-06-28 DIAGNOSIS — I700 Atherosclerosis of aorta: Secondary | ICD-10-CM

## 2021-07-21 ENCOUNTER — Ambulatory Visit
Admission: RE | Admit: 2021-07-21 | Discharge: 2021-07-21 | Disposition: A | Discharge status: Home / Self Care | Payer: Self-pay | Source: Ambulatory Visit | Attending: Internal Medicine | Admitting: Internal Medicine

## 2021-07-21 DIAGNOSIS — I700 Atherosclerosis of aorta: Secondary | ICD-10-CM

## 2021-07-28 DIAGNOSIS — I2510 Atherosclerotic heart disease of native coronary artery without angina pectoris: Secondary | ICD-10-CM | POA: Diagnosis not present

## 2021-07-28 DIAGNOSIS — H43392 Other vitreous opacities, left eye: Secondary | ICD-10-CM | POA: Diagnosis not present

## 2021-08-03 DIAGNOSIS — H2513 Age-related nuclear cataract, bilateral: Secondary | ICD-10-CM | POA: Diagnosis not present

## 2021-08-03 DIAGNOSIS — H02831 Dermatochalasis of right upper eyelid: Secondary | ICD-10-CM | POA: Diagnosis not present

## 2021-08-03 DIAGNOSIS — H43812 Vitreous degeneration, left eye: Secondary | ICD-10-CM | POA: Diagnosis not present

## 2021-08-03 DIAGNOSIS — H02834 Dermatochalasis of left upper eyelid: Secondary | ICD-10-CM | POA: Diagnosis not present

## 2022-07-10 DIAGNOSIS — I10 Essential (primary) hypertension: Secondary | ICD-10-CM | POA: Diagnosis not present

## 2022-07-10 DIAGNOSIS — Z125 Encounter for screening for malignant neoplasm of prostate: Secondary | ICD-10-CM | POA: Diagnosis not present

## 2022-07-10 DIAGNOSIS — R7309 Other abnormal glucose: Secondary | ICD-10-CM | POA: Diagnosis not present

## 2022-07-13 DIAGNOSIS — Z23 Encounter for immunization: Secondary | ICD-10-CM | POA: Diagnosis not present

## 2022-07-13 DIAGNOSIS — I2510 Atherosclerotic heart disease of native coronary artery without angina pectoris: Secondary | ICD-10-CM | POA: Diagnosis not present

## 2022-07-13 DIAGNOSIS — E7800 Pure hypercholesterolemia, unspecified: Secondary | ICD-10-CM | POA: Diagnosis not present

## 2022-07-13 DIAGNOSIS — Z8249 Family history of ischemic heart disease and other diseases of the circulatory system: Secondary | ICD-10-CM | POA: Diagnosis not present

## 2022-07-13 DIAGNOSIS — F419 Anxiety disorder, unspecified: Secondary | ICD-10-CM | POA: Diagnosis not present

## 2022-07-13 DIAGNOSIS — I700 Atherosclerosis of aorta: Secondary | ICD-10-CM | POA: Diagnosis not present

## 2022-07-13 DIAGNOSIS — R7309 Other abnormal glucose: Secondary | ICD-10-CM | POA: Diagnosis not present

## 2022-07-13 DIAGNOSIS — Z0000 Encounter for general adult medical examination without abnormal findings: Secondary | ICD-10-CM | POA: Diagnosis not present

## 2022-07-13 DIAGNOSIS — R002 Palpitations: Secondary | ICD-10-CM | POA: Diagnosis not present

## 2022-12-22 IMAGING — CT CT CARDIAC CORONARY ARTERY CALCIUM SCORE
3 series · 14 of 20 positions shown, 16 images · non-contrast
Comparison: None.

CLINICAL DATA: 65-year-old Caucasian male with history of
hyperlipidemia, hypertension and family history of heart disease.

EXAM:
CT CARDIAC CORONARY ARTERY CALCIUM SCORE
TECHNIQUE: Non-contrast imaging through the heart was performed using
prospective ECG gating. Image post processing was performed on an
independent workstation, allowing for quantitative analysis of the
heart and coronary arteries. Note that this exam targets the heart
and the chest was not imaged in its entirety.

[Series 2: calcium scoring 2.00 qr36 bestdiast 69% hrt calciu · axial · 0.39mm/px · z∈[+1671,+1743]mm · 4 of 62 slices shown]
[im 13/62  vessel]
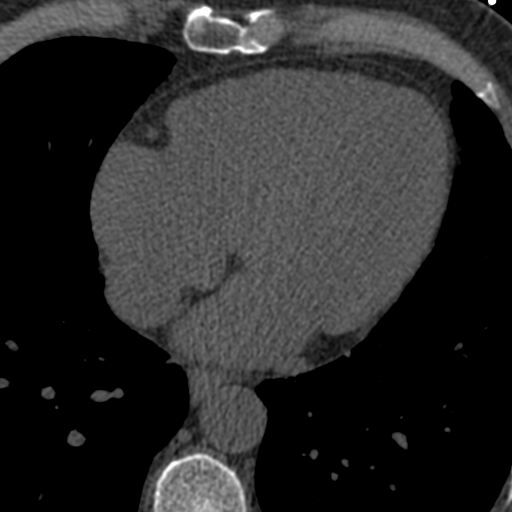
[im 25/62  vessel]
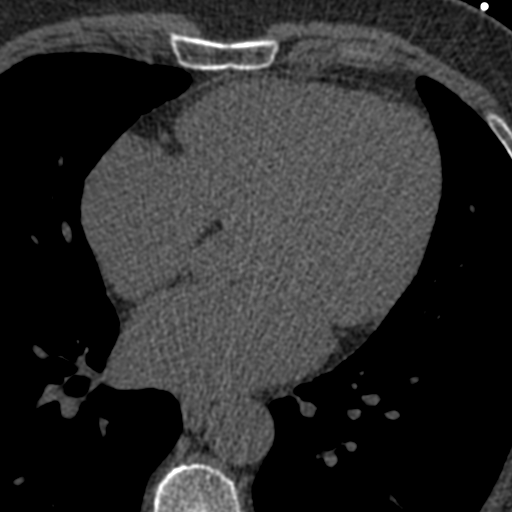
[im 37/62  vessel]
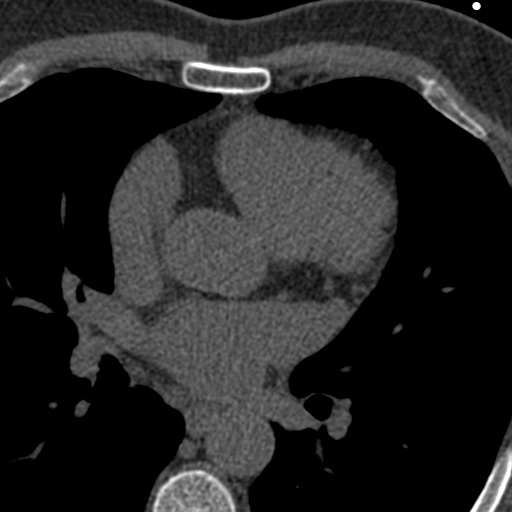
[im 49/62  vessel]
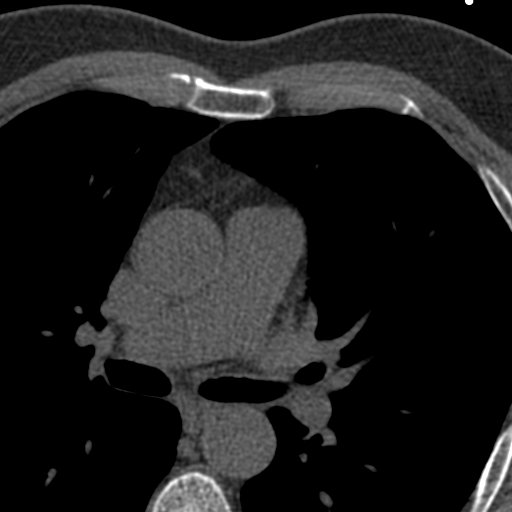

[Series 3: calcium scoring 2.00 br40 bestdiast 69% axial · axial · 0.61mm/px · z∈[+1637,+1741]mm · 5 of 80 slices shown, 7 images]
[im 14/80  vessel]
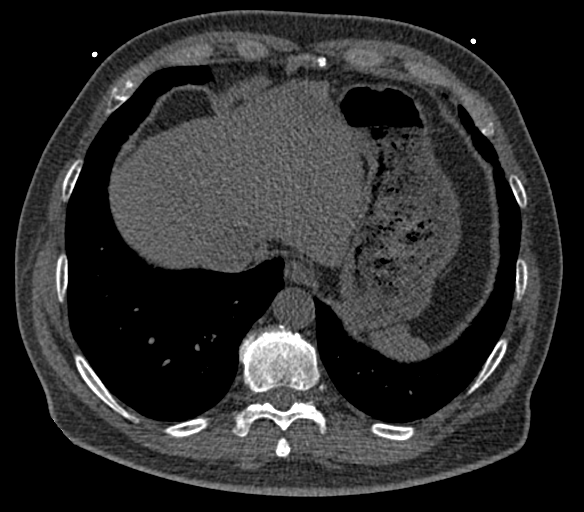
[im 14/80  lung]
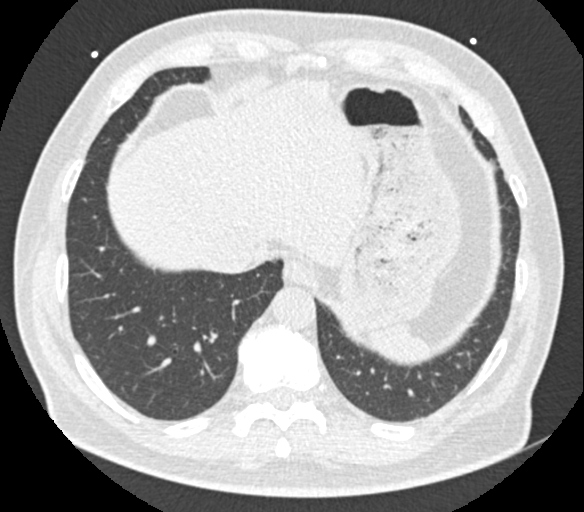
[im 27/80  vessel]
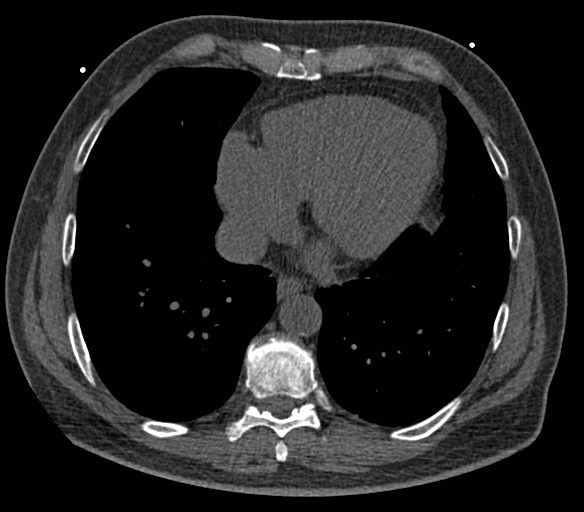
[im 40/80  vessel]
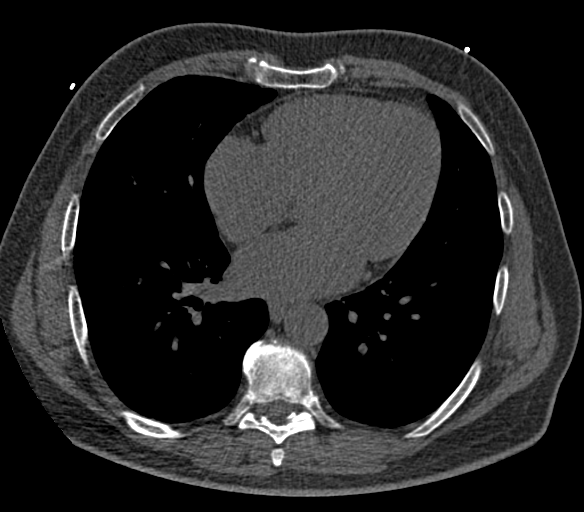
[im 53/80  vessel]
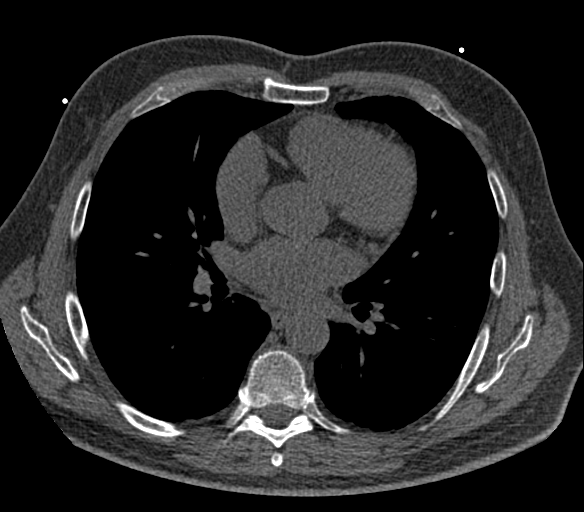
[im 66/80  vessel]
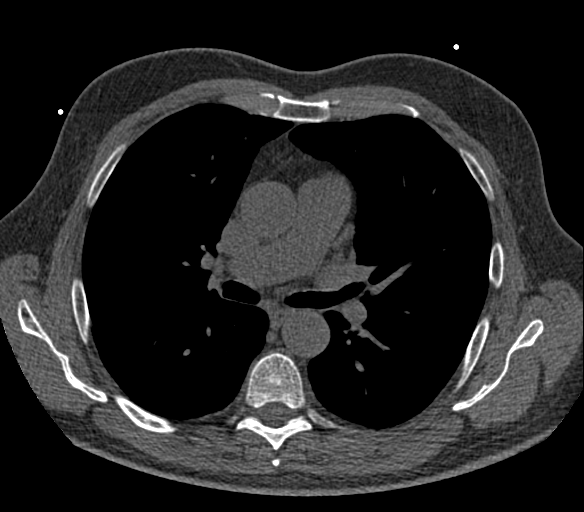
[im 66/80  lung]
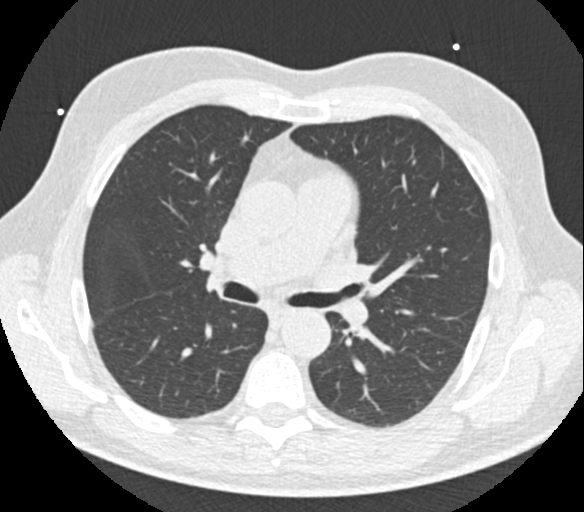

[Series 9: calcium scoring 2.00 br60 bestdiast 69% lungs · axial · 0.61mm/px · z∈[+1637,+1741]mm · 5 of 80 slices shown]
[im 14/80  vessel]
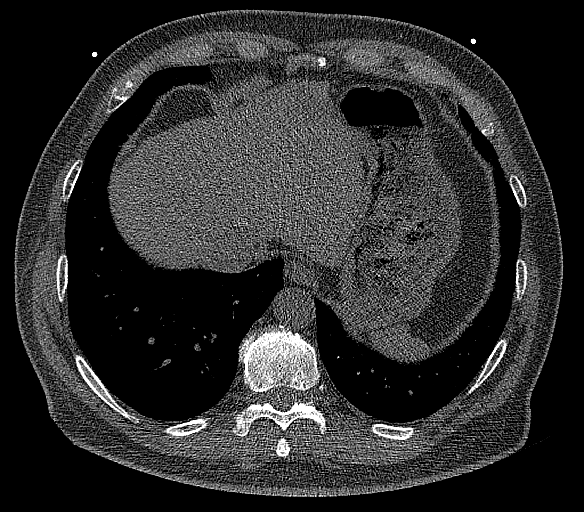
[im 27/80  vessel]
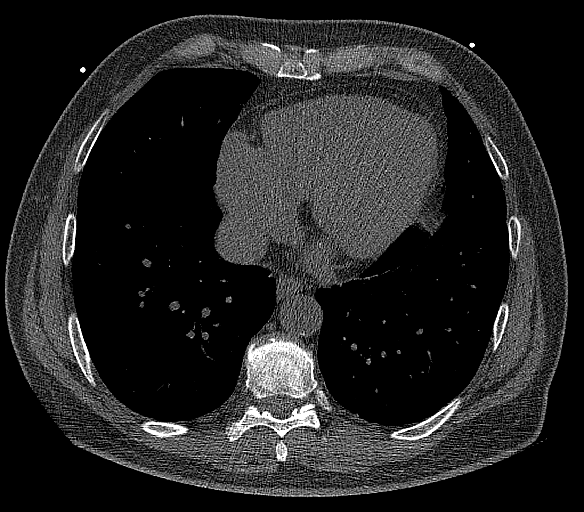
[im 40/80  vessel]
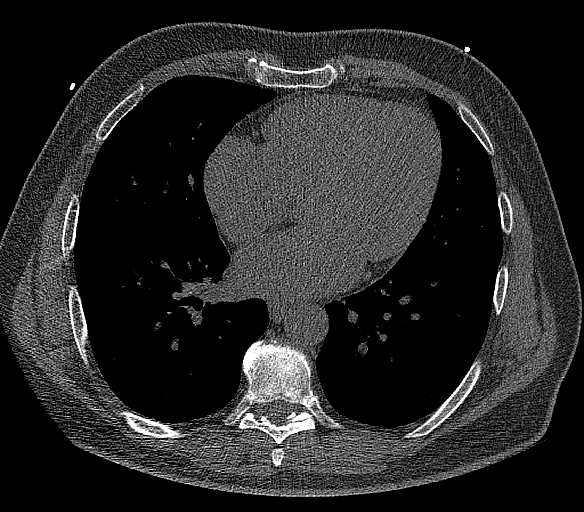
[im 53/80  vessel]
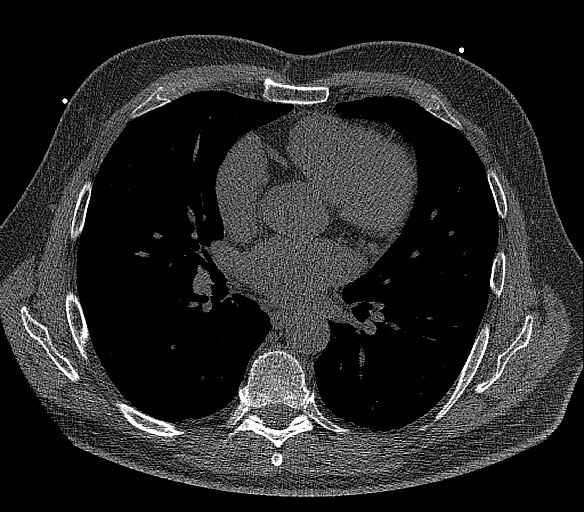
[im 66/80  vessel]
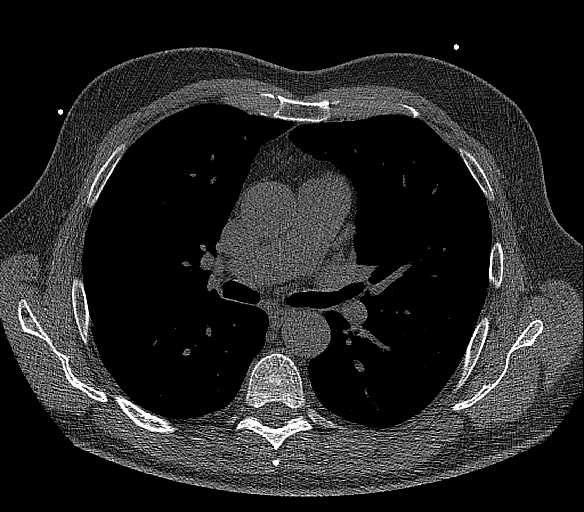

[14 of 20 positions shown; findings below may reference images not displayed]

FINDINGS: CORONARY CALCIUM SCORES:

Left Main: 0

LAD:

LCx: 0

RCA: 0

Total Agatston Score:

[HOSPITAL] percentile: 29

AORTA MEASUREMENTS:

Ascending Aorta: 32 mm

Descending Aorta: 26 mm

OTHER FINDINGS:

The heart size is within normal limits. No pericardial fluid is
identified. Visualized segments of the thoracic aorta and central
pulmonary arteries are normal in caliber. Visualized mediastinum and
hilar regions demonstrate no lymphadenopathy or masses. Small
calcified granuloma in the right middle lobe and punctate calcified
granuloma in the left upper lobe. Visualized lungs show no evidence
of pulmonary edema, consolidation, pneumothorax or pleural fluid.
Visualized upper abdomen and bony structures are unremarkable.
IMPRESSION: 1. Coronary calcium score of 12.3 is at the 29th percentile for the
patient's age, sex and race.
2. Small calcified granulomata in the right middle lobe and left
upper lobe are consistent with prior granulomatous disease.

## 2023-07-09 DIAGNOSIS — Z125 Encounter for screening for malignant neoplasm of prostate: Secondary | ICD-10-CM | POA: Diagnosis not present

## 2023-07-09 DIAGNOSIS — R7309 Other abnormal glucose: Secondary | ICD-10-CM | POA: Diagnosis not present

## 2023-07-09 DIAGNOSIS — I700 Atherosclerosis of aorta: Secondary | ICD-10-CM | POA: Diagnosis not present

## 2023-07-09 DIAGNOSIS — I10 Essential (primary) hypertension: Secondary | ICD-10-CM | POA: Diagnosis not present

## 2023-07-17 DIAGNOSIS — E7800 Pure hypercholesterolemia, unspecified: Secondary | ICD-10-CM | POA: Diagnosis not present

## 2023-07-17 DIAGNOSIS — F419 Anxiety disorder, unspecified: Secondary | ICD-10-CM | POA: Diagnosis not present

## 2023-07-17 DIAGNOSIS — R7309 Other abnormal glucose: Secondary | ICD-10-CM | POA: Diagnosis not present

## 2023-07-17 DIAGNOSIS — Z0000 Encounter for general adult medical examination without abnormal findings: Secondary | ICD-10-CM | POA: Diagnosis not present

## 2023-07-17 DIAGNOSIS — I10 Essential (primary) hypertension: Secondary | ICD-10-CM | POA: Diagnosis not present

## 2023-07-17 DIAGNOSIS — N400 Enlarged prostate without lower urinary tract symptoms: Secondary | ICD-10-CM | POA: Diagnosis not present

## 2023-07-17 DIAGNOSIS — I700 Atherosclerosis of aorta: Secondary | ICD-10-CM | POA: Diagnosis not present

## 2023-07-17 DIAGNOSIS — I2510 Atherosclerotic heart disease of native coronary artery without angina pectoris: Secondary | ICD-10-CM | POA: Diagnosis not present

## 2023-07-17 DIAGNOSIS — Z23 Encounter for immunization: Secondary | ICD-10-CM | POA: Diagnosis not present
